# Patient Record
Sex: Female | Born: 1975 | Race: White | Hispanic: No | Smoking: Current every day smoker
Health system: Southern US, Community
[De-identification: ages and names within clinical notes are randomized; demographics above are authoritative.]

## PROBLEM LIST (undated history)

## (undated) DIAGNOSIS — M4056 Lordosis, unspecified, lumbar region: Secondary | ICD-10-CM

## (undated) DIAGNOSIS — Z9119 Patient's noncompliance with other medical treatment and regimen: Secondary | ICD-10-CM

## (undated) DIAGNOSIS — Z720 Tobacco use: Secondary | ICD-10-CM

## (undated) DIAGNOSIS — N879 Dysplasia of cervix uteri, unspecified: Secondary | ICD-10-CM

## (undated) DIAGNOSIS — M1990 Unspecified osteoarthritis, unspecified site: Secondary | ICD-10-CM

## (undated) DIAGNOSIS — G8929 Other chronic pain: Secondary | ICD-10-CM

## (undated) DIAGNOSIS — M549 Dorsalgia, unspecified: Secondary | ICD-10-CM

## (undated) DIAGNOSIS — Z91199 Patient's noncompliance with other medical treatment and regimen due to unspecified reason: Secondary | ICD-10-CM

## (undated) DIAGNOSIS — D649 Anemia, unspecified: Secondary | ICD-10-CM

## (undated) DIAGNOSIS — B1920 Unspecified viral hepatitis C without hepatic coma: Secondary | ICD-10-CM

## (undated) DIAGNOSIS — I10 Essential (primary) hypertension: Secondary | ICD-10-CM

## (undated) DIAGNOSIS — F419 Anxiety disorder, unspecified: Secondary | ICD-10-CM

## (undated) HISTORY — PX: WRIST SURGERY: SHX841

## (undated) HISTORY — PX: DENTAL SURGERY: SHX609

---
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
## 2015-12-12 ENCOUNTER — Emergency Department (HOSPITAL_COMMUNITY)
Admission: EM | Admit: 2015-12-12 | Discharge: 2015-12-12 | Disposition: A | Discharge status: Home / Self Care | Payer: No Typology Code available for payment source | Attending: Emergency Medicine | Admitting: Emergency Medicine

## 2015-12-12 ENCOUNTER — Emergency Department (HOSPITAL_COMMUNITY): Payer: No Typology Code available for payment source

## 2015-12-12 ENCOUNTER — Encounter (HOSPITAL_COMMUNITY): Payer: Self-pay | Admitting: *Deleted

## 2015-12-12 DIAGNOSIS — Y9289 Other specified places as the place of occurrence of the external cause: Secondary | ICD-10-CM | POA: Insufficient documentation

## 2015-12-12 DIAGNOSIS — L02411 Cutaneous abscess of right axilla: Secondary | ICD-10-CM

## 2015-12-12 DIAGNOSIS — S8265XA Nondisplaced fracture of lateral malleolus of left fibula, initial encounter for closed fracture: Secondary | ICD-10-CM

## 2015-12-12 DIAGNOSIS — F17200 Nicotine dependence, unspecified, uncomplicated: Secondary | ICD-10-CM | POA: Diagnosis not present

## 2015-12-12 DIAGNOSIS — J40 Bronchitis, not specified as acute or chronic: Secondary | ICD-10-CM | POA: Diagnosis not present

## 2015-12-12 DIAGNOSIS — Y999 Unspecified external cause status: Secondary | ICD-10-CM | POA: Diagnosis not present

## 2015-12-12 DIAGNOSIS — S99912A Unspecified injury of left ankle, initial encounter: Secondary | ICD-10-CM | POA: Diagnosis present

## 2015-12-12 DIAGNOSIS — Y939 Activity, unspecified: Secondary | ICD-10-CM | POA: Diagnosis not present

## 2015-12-12 MED ORDER — PROMETHAZINE-DM 6.25-15 MG/5ML PO SYRP: 5.0000 mL | ORAL_SOLUTION | Freq: Four times a day (QID) | ORAL | 0 refills | Status: DC | PRN

## 2015-12-12 MED ORDER — LIDOCAINE HCL 2 % IJ SOLN
20.0000 mL | Freq: Once | INTRAMUSCULAR | Status: AC
  Administered 2015-12-12: 400 mg
  Filled 2015-12-12: qty 20

## 2015-12-12 MED ORDER — GUAIFENESIN ER 1200 MG PO TB12: 1.0000 | ORAL_TABLET | Freq: Two times a day (BID) | ORAL | 0 refills | Status: DC

## 2015-12-12 MED ORDER — PREDNISONE 50 MG PO TABS: 50.0000 mg | ORAL_TABLET | Freq: Every day | ORAL | 0 refills | Status: DC

## 2015-12-12 MED ORDER — HYDROCODONE-ACETAMINOPHEN 5-325 MG PO TABS: 1.0000 | ORAL_TABLET | Freq: Four times a day (QID) | ORAL | 0 refills | Status: DC | PRN

## 2015-12-12 MED ORDER — HYDROCODONE-ACETAMINOPHEN 5-325 MG PO TABS
1.0000 | ORAL_TABLET | Freq: Once | ORAL | Status: AC
  Administered 2015-12-12: 1 via ORAL
  Filled 2015-12-12: qty 1

## 2015-12-12 NOTE — Progress Notes (Signed)
Orthopedic Tech Progress Note Patient Details:  Jeanette Capricelizabeth Diggs 12/23/1975 [id number]  Ortho Devices Type of Ortho Device: Crutches Ortho Device/Splint Interventions: Application   Nikki Domrawford, Plato Alspaugh 12/12/2015, 2:08 PM Buddy tape was done by nursing staff
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
## 2015-12-12 NOTE — ED Triage Notes (Signed)
PT requesting narcotics for pain.
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
## 2015-12-12 NOTE — Discharge Instructions (Signed)
Follow-up with the orthopedist provided.  Return here as needed.  Increase her fluid intake.  Ice and elevate your ankle

## 2016-01-14 ENCOUNTER — Encounter (HOSPITAL_COMMUNITY): Payer: Self-pay | Admitting: Emergency Medicine

## 2016-01-14 ENCOUNTER — Emergency Department (HOSPITAL_COMMUNITY): Payer: Self-pay

## 2016-01-14 ENCOUNTER — Emergency Department (HOSPITAL_COMMUNITY)
Admission: EM | Admit: 2016-01-14 | Discharge: 2016-01-14 | Disposition: A | Discharge status: Home / Self Care | Payer: Self-pay | Attending: Emergency Medicine | Admitting: Emergency Medicine

## 2016-01-14 DIAGNOSIS — F17200 Nicotine dependence, unspecified, uncomplicated: Secondary | ICD-10-CM | POA: Insufficient documentation

## 2016-01-14 DIAGNOSIS — L02411 Cutaneous abscess of right axilla: Secondary | ICD-10-CM | POA: Insufficient documentation

## 2016-01-14 DIAGNOSIS — L0291 Cutaneous abscess, unspecified: Secondary | ICD-10-CM

## 2016-01-14 DIAGNOSIS — B349 Viral infection, unspecified: Secondary | ICD-10-CM | POA: Insufficient documentation

## 2016-01-14 MED ORDER — BENZONATATE 100 MG PO CAPS
200.0000 mg | ORAL_CAPSULE | Freq: Once | ORAL | Status: AC
  Administered 2016-01-14: 200 mg via ORAL
  Filled 2016-01-14: qty 2

## 2016-01-14 MED ORDER — LIDOCAINE-EPINEPHRINE (PF) 2 %-1:200000 IJ SOLN
10.0000 mL | Freq: Once | INTRAMUSCULAR | Status: AC
  Administered 2016-01-14: 10 mL
  Filled 2016-01-14: qty 20

## 2016-01-14 MED ORDER — OXYMETAZOLINE HCL 0.05 % NA SOLN: 1.0000 | Freq: Two times a day (BID) | NASAL | 0 refills | Status: AC

## 2016-01-14 MED ORDER — BENZONATATE 100 MG PO CAPS: 200.0000 mg | ORAL_CAPSULE | Freq: Two times a day (BID) | ORAL | 0 refills | Status: AC | PRN

## 2016-01-14 MED ORDER — CEPHALEXIN 500 MG PO CAPS: 500.0000 mg | ORAL_CAPSULE | Freq: Four times a day (QID) | ORAL | 0 refills | Status: DC

## 2016-01-14 MED ORDER — ACETAMINOPHEN 500 MG PO TABS
1000.0000 mg | ORAL_TABLET | Freq: Once | ORAL | Status: AC
  Administered 2016-01-14: 1000 mg via ORAL
  Filled 2016-01-14: qty 2

## 2016-01-14 MED ORDER — SULFAMETHOXAZOLE-TRIMETHOPRIM 800-160 MG PO TABS: 1.0000 | ORAL_TABLET | Freq: Two times a day (BID) | ORAL | 0 refills | Status: AC

## 2016-01-14 NOTE — ED Provider Notes (Signed)
[facility] Provider Note    By signing my name below, I, Earmon Phoenix, attest that this documentation has been prepared under the direction and in the presence of Melburn Hake, PA-C. Electronically Signed: Earmon Phoenix, ED Scribe. 01/14/16. 3:46 PM.    History   Chief Complaint Chief Complaint  Patient presents with  . Cough  . Wound Infection   The history is provided by the patient and medical records. No language interpreter was used.    Kelli Franklin is an obese 41 y.o. female who presents to the Emergency Department complaining of dry cough that began about one week ago. She reports associated generalized body aches, HA, diaphoresis and wheezing. She has taken Alka Seltzer cough and cold, Tylenol sinus medication and Ibuprofen for her symptoms with no significant relief. Coughing and moving increase her pain. Pt denies alleviating  factors. Denies any known sick contacts or recent hospitalizations. She denies sore throat, fever, chills, neck stiffness, nasal congestion, rhinorrhea, abdominal pain, nausea, vomiting, diarrhea, dysuria, rash, CP, sinus pain or pressure. She is a smoker of 1 PPD but states she has not smoked but about 5 per day since getting sick.   She also reports several abscesses to her right axilla that appeared about one week ago. She reports she has these recurrently and has had them incised and drained in the past. She has not done anything to treat the area. Touching the area or elevating the RUE increases the pain. She denies alleviating factors.    History reviewed. No pertinent past medical history.  There are no active problems to display for this patient.   Past Surgical History:  Procedure Laterality Date  . WRIST SURGERY      OB History    No data available       Home Medications    Prior to Admission medications   Medication Sig Start Date End Date Taking? Authorizing Provider  ibuprofen (ADVIL,MOTRIN) 200 MG tablet  Take 800 mg by mouth every 8 (eight) hours as needed for moderate pain.   Yes Historical Provider, MD  Phenyleph-Doxylamine-DM-APAP (ALKA-SELTZER PLS ALLERGY & CGH) 5-6.25-10-325 MG CAPS Take 2 capsules by mouth daily as needed (cold).   Yes Historical Provider, MD  benzonatate (TESSALON) 100 MG capsule Take 2 capsules (200 mg total) by mouth 2 (two) times daily as needed for cough. 01/14/16   Barrett Henle, PA-C  cephALEXin (KEFLEX) 500 MG capsule Take 1 capsule (500 mg total) by mouth 4 (four) times daily. 01/14/16   Barrett Henle, PA-C  oxymetazoline (AFRIN NASAL SPRAY) 0.05 % nasal spray Place 1 spray into both nostrils 2 (two) times daily. Spray once into each nostril twice daily for up to the next 3 days. Do not use for more than 3 days to prevent rebound rhinorrhea. 01/14/16   Barrett Henle, PA-C  sulfamethoxazole-trimethoprim (BACTRIM DS,SEPTRA DS) 800-160 MG tablet Take 1 tablet by mouth 2 (two) times daily. 01/14/16 01/21/16  Barrett Henle, PA-C    Family History No family history on file.  Social History Social History  Substance Use Topics  . Smoking status: Current Every Day Smoker  . Smokeless tobacco: Never Used  . Alcohol use No     Allergies   Sulfa antibiotics   Review of Systems Review of Systems  Constitutional: Negative for fever.  HENT: Negative for congestion, rhinorrhea, sinus pain, sinus pressure and sore throat.   Respiratory: Positive for cough and wheezing.   Cardiovascular: Negative for chest pain.  Gastrointestinal: Negative for abdominal pain, diarrhea, nausea and vomiting.  Musculoskeletal: Positive for myalgias.  Skin: Positive for color change (right axilla abscess). Negative for rash.  Neurological: Positive for headaches.     Physical Exam Updated Vital Signs BP 130/83 (BP Location: Left Arm)   Pulse 72   Temp 97.8 F (36.6 C) (Oral)   Resp 16   SpO2 99%   Physical Exam  Constitutional: She is  oriented to person, place, and time. She appears well-developed and well-nourished.  HENT:  Head: Normocephalic and atraumatic.  Right Ear: Tympanic membrane normal.  Left Ear: Tympanic membrane normal.  Nose: Nose normal. Right sinus exhibits no maxillary sinus tenderness and no frontal sinus tenderness. Left sinus exhibits no maxillary sinus tenderness and no frontal sinus tenderness.  Mouth/Throat: Uvula is midline, oropharynx is clear and moist and mucous membranes are normal. No oropharyngeal exudate, posterior oropharyngeal edema, posterior oropharyngeal erythema or tonsillar abscesses. No tonsillar exudate.  Eyes: Conjunctivae and EOM are normal. Right eye exhibits no discharge. Left eye exhibits no discharge. No scleral icterus.  Cardiovascular: Normal rate, regular rhythm, normal heart sounds and intact distal pulses.   Pulmonary/Chest: Effort normal. She has wheezes. She has rales.  Mild expiratory wheezing and rales in left lower lobe. Pt without signs of increased work of breathing or respiratory distress.  Abdominal: Soft. Bowel sounds are normal. There is no tenderness.  Musculoskeletal: She exhibits no edema.  Neurological: She is alert and oriented to person, place, and time.  Skin: Skin is warm and dry.  1x2cm area of induration and fluctuance noted to right axilla. No surrounding erythema, warmth or drainage noted.  Nursing note and vitals reviewed.    ED Treatments / Results  DIAGNOSTIC STUDIES: Oxygen Saturation is 99% on RA, normal by my interpretation.   COORDINATION OF CARE: 2:38 PM- Will order CXR. Will I & D abscess of right axilla. Pt verbalizes understanding and agrees to plan.   Medications  acetaminophen (TYLENOL) tablet 1,000 mg (1,000 mg Oral Given 01/14/16 1529)  benzonatate (TESSALON) capsule 200 mg (200 mg Oral Given 01/14/16 1529)  lidocaine-EPINEPHrine (XYLOCAINE W/EPI) 2 %-1:200000 (PF) injection 10 mL (10 mLs Infiltration Given 01/14/16 1530)     Labs (all labs ordered are listed, but only abnormal results are displayed) Labs Reviewed - No data to display  EKG  EKG Interpretation None       Radiology Dg Chest 2 View  Result Date: 01/14/2016 CLINICAL DATA:  Cough over the last week.  Body aches and chills. EXAM: CHEST  2 VIEW COMPARISON:  12/12/2015 FINDINGS: Central bronchial thickening. No infiltrate, mass, effusion or collapse. No abnormal bone finding. Heart size is normal. Pulmonary vascularity is normal. IMPRESSION: Bronchial thickening.  No infiltrate or collapse. Electronically Signed   By: Paulina FusiMark  Shogry M.D.   On: 01/14/2016 15:02    Procedures .Marland Kitchen.Incision and Drainage Date/Time: 01/14/2016 4:36 PM Performed by: Barrett HenleNADEAU, NICOLE Shakila Authorized by: Barrett HenleNADEAU, NICOLE Aquita   Consent:    Consent obtained:  Verbal   Consent given by:  Patient Location:    Type:  Abscess   Size:  1x2cm   Location:  Upper extremity   Upper extremity location: right axilla. Pre-procedure details:    Skin preparation:  Betadine Anesthesia (see MAR for exact dosages):    Anesthesia method:  Local infiltration   Local anesthetic:  Lidocaine 2% WITH epi Procedure type:    Complexity:  Simple Procedure details:    Incision types:  Single straight  Incision depth:  Dermal   Scalpel blade:  11   Wound management:  Probed and deloculated and irrigated with saline   Drainage:  Bloody and purulent   Drainage amount:  Scant   Wound treatment:  Wound left open   Packing materials:  None Post-procedure details:    Patient tolerance of procedure:  Tolerated well, no immediate complications   (including critical care time)  Medications Ordered in ED Medications  acetaminophen (TYLENOL) tablet 1,000 mg (1,000 mg Oral Given 01/14/16 1529)  benzonatate (TESSALON) capsule 200 mg (200 mg Oral Given 01/14/16 1529)  lidocaine-EPINEPHrine (XYLOCAINE W/EPI) 2 %-1:200000 (PF) injection 10 mL (10 mLs Infiltration Given 01/14/16 1530)      Initial Impression / Assessment and Plan / ED Course  I have reviewed the triage vital signs and the nursing notes.  Pertinent labs & imaging results that were available during my care of the patient were reviewed by me and considered in my medical decision making (see chart for details).  Clinical Course     Patient with symptoms consistent with influenza.  Vitals are stable, afebrile.  No signs of dehydration, tolerating PO's.  CXR negative.  Discussed the cost versus benefit of Tamiflu treatment with the patient.  The patient understands that symptoms are greater than the recommended 24-48 hour window of treatment.  Patient will be discharged with instructions to orally hydrate, rest, and use over-the-counter medications such as anti-inflammatories ibuprofen and Aleve for muscle aches and Tylenol for fever.  Patient will also be given a cough suppressant and decongestant.   Patient with skin abscess amenable to incision and drainage.  Abscess was not large enough to warrant packing or drain,  wound recheck in 2 days. Encouraged home warm soaks and flushing.  Patient discharged home with prescription of Keflex and Bactrim due to history of recurrent abscesses.  Will d/c to home.  Discussed return precautions.  I personally performed the services described in this documentation, which was scribed in my presence. The recorded information has been reviewed and is accurate.   Final Clinical Impressions(s) / ED Diagnoses   Final diagnoses:  Viral illness  Abscess    New Prescriptions New Prescriptions   BENZONATATE (TESSALON) 100 MG CAPSULE    Take 2 capsules (200 mg total) by mouth 2 (two) times daily as needed for cough.   CEPHALEXIN (KEFLEX) 500 MG CAPSULE    Take 1 capsule (500 mg total) by mouth 4 (four) times daily.   OXYMETAZOLINE (AFRIN NASAL SPRAY) 0.05 % NASAL SPRAY    Place 1 spray into both nostrils 2 (two) times daily. Spray once into each nostril twice daily for up to the  next 3 days. Do not use for more than 3 days to prevent rebound rhinorrhea.   SULFAMETHOXAZOLE-TRIMETHOPRIM (BACTRIM DS,SEPTRA DS) 800-160 MG TABLET    Take 1 tablet by mouth 2 (two) times daily.     Brandalyn Harting [place], [place] 01/14/16 1638    Shaune Pollack, MD 01/15/16 [phone]

## 2016-01-14 NOTE — ED Notes (Signed)
Pt verbalized understanding discharge instructions and denies any further needs or questions at this time. VS stable, ambulatory and steady gait.   

## 2016-01-14 NOTE — Discharge Instructions (Signed)
Take your antibiotics as prescribed until completed. I recommend continuing to apply warm compresses 3-4 times daily for 15-20 minutes to help with your abscess. You may also take Tylenol or ibuprofen as prescribed over-the-counter as needed for pain relief. Continue drinking fluids at home to remain hydrated. I also recommend eating a bland diet for the next few days and her symptoms have improved. Follow-up with your primary care provider in the next 3-4 days if your symptoms have not improved or have worsened. Please return to the Emergency Department if symptoms worsen or new onset of fever, redness, swelling, warmth, difficulty breathing, vomiting, unable to keep fluids down.

## 2016-01-14 NOTE — ED Triage Notes (Addendum)
Cough x 1 week  otc meds not helping  Body aches and chills also has lump under rt arm she wants looked at

## 2016-02-07 ENCOUNTER — Emergency Department
Admission: EM | Admit: 2016-02-07 | Discharge: 2016-02-08 | Disposition: A | Discharge status: Home / Self Care | Payer: Medicaid Other | Attending: Emergency Medicine | Admitting: Emergency Medicine

## 2016-02-07 ENCOUNTER — Encounter: Payer: Self-pay | Admitting: Emergency Medicine

## 2016-02-07 DIAGNOSIS — F1310 Sedative, hypnotic or anxiolytic abuse, uncomplicated: Secondary | ICD-10-CM

## 2016-02-07 DIAGNOSIS — T50901A Poisoning by unspecified drugs, medicaments and biological substances, accidental (unintentional), initial encounter: Secondary | ICD-10-CM

## 2016-02-07 DIAGNOSIS — I10 Essential (primary) hypertension: Secondary | ICD-10-CM | POA: Insufficient documentation

## 2016-02-07 DIAGNOSIS — T424X1A Poisoning by benzodiazepines, accidental (unintentional), initial encounter: Secondary | ICD-10-CM

## 2016-02-07 DIAGNOSIS — T402X4A Poisoning by other opioids, undetermined, initial encounter: Secondary | ICD-10-CM | POA: Insufficient documentation

## 2016-02-07 DIAGNOSIS — F17200 Nicotine dependence, unspecified, uncomplicated: Secondary | ICD-10-CM | POA: Insufficient documentation

## 2016-02-07 DIAGNOSIS — T424X4A Poisoning by benzodiazepines, undetermined, initial encounter: Secondary | ICD-10-CM | POA: Insufficient documentation

## 2016-02-07 DIAGNOSIS — Z79899 Other long term (current) drug therapy: Secondary | ICD-10-CM | POA: Insufficient documentation

## 2016-02-07 DIAGNOSIS — F1120 Opioid dependence, uncomplicated: Secondary | ICD-10-CM

## 2016-02-07 DIAGNOSIS — T50904A Poisoning by unspecified drugs, medicaments and biological substances, undetermined, initial encounter: Secondary | ICD-10-CM

## 2016-02-07 DIAGNOSIS — Z791 Long term (current) use of non-steroidal anti-inflammatories (NSAID): Secondary | ICD-10-CM | POA: Insufficient documentation

## 2016-02-07 HISTORY — DX: Lordosis, unspecified, lumbar region: M40.56

## 2016-02-07 HISTORY — DX: Unspecified osteoarthritis, unspecified site: M19.90

## 2016-02-07 HISTORY — DX: Patient's noncompliance with other medical treatment and regimen: Z91.19

## 2016-02-07 HISTORY — DX: Dorsalgia, unspecified: M54.9

## 2016-02-07 HISTORY — DX: Patient's noncompliance with other medical treatment and regimen due to unspecified reason: Z91.199

## 2016-02-07 HISTORY — DX: Dysplasia of cervix uteri, unspecified: N87.9

## 2016-02-07 HISTORY — DX: Anemia, unspecified: D64.9

## 2016-02-07 HISTORY — DX: Unspecified viral hepatitis C without hepatic coma: B19.20

## 2016-02-07 HISTORY — DX: Other chronic pain: G89.29

## 2016-02-07 HISTORY — DX: Essential (primary) hypertension: I10

## 2016-02-07 HISTORY — DX: Anxiety disorder, unspecified: F41.9

## 2016-02-07 HISTORY — DX: Tobacco use: Z72.0

## 2016-02-07 LAB — COMPREHENSIVE METABOLIC PANEL
ALK PHOS: 66 U/L (ref 38–126)
ALT: 159 U/L — ABNORMAL HIGH (ref 14–54)
AST: 60 U/L — ABNORMAL HIGH (ref 15–41)
Albumin: 4.1 g/dL (ref 3.5–5.0)
Anion gap: 9 (ref 5–15)
BILIRUBIN TOTAL: 0.3 mg/dL (ref 0.3–1.2)
BUN: 8 mg/dL (ref 6–20)
CALCIUM: 8.9 mg/dL (ref 8.9–10.3)
CO2: 25 mmol/L (ref 22–32)
CREATININE: 0.65 mg/dL (ref 0.44–1.00)
Chloride: 104 mmol/L (ref 101–111)
Glucose, Bld: 116 mg/dL — ABNORMAL HIGH (ref 65–99)
Potassium: 3.4 mmol/L — ABNORMAL LOW (ref 3.5–5.1)
Sodium: 138 mmol/L (ref 135–145)
Total Protein: 7.8 g/dL (ref 6.5–8.1)

## 2016-02-07 LAB — CBC
HCT: 37.9 % (ref 35.0–47.0)
Hemoglobin: 13.3 g/dL (ref 12.0–16.0)
MCH: 29.8 pg (ref 26.0–34.0)
MCHC: 35.2 g/dL (ref 32.0–36.0)
MCV: 84.8 fL (ref 80.0–100.0)
PLATELETS: 229 10*3/uL (ref 150–440)
RBC: 4.47 MIL/uL (ref 3.80–5.20)
RDW: 13.8 % (ref 11.5–14.5)
WBC: 7.8 10*3/uL (ref 3.6–11.0)

## 2016-02-07 LAB — SALICYLATE LEVEL

## 2016-02-07 LAB — ACETAMINOPHEN LEVEL: Acetaminophen (Tylenol), Serum: <10 ug/mL — ABNORMAL LOW (ref 10–30)

## 2016-02-07 LAB — ETHANOL

## 2016-02-07 NOTE — ED Triage Notes (Signed)
Pt presents to ED via ems from home c/o drug overdose: xanax, oxycodone and unknown substance. Pt vomited x1, awake on arrival.

## 2016-02-07 NOTE — ED Notes (Addendum)
Spoke with poison control . Stated pt to be watched for 6 hours. AVoid narcan if possible; if choose to give then give in small doses to avoid intubation

## 2016-02-07 NOTE — ED Provider Notes (Signed)
[facility] Emergency Department Provider Note  ____________________________________________   First MD Initiated Contact with Patient 02/07/16 2255     (approximate)  I have reviewed the triage vital signs and the nursing notes.   HISTORY  Chief Complaint Drug Overdose  Level 5 caveat:  history/ROS limited by acute/critical illness  HPI Kelli Franklin is a 41 y.o. female with a medical history as listed below which was gleaned from the EMR from prior visits to [facility].  She presents for evaluation after an alleged overdose.No additional detail is available except that she reportedly overdosed on Xanax, oxycodone, and an unknown substance.  No other details are available.  She was reportedly awake and vomited once upon arrival.  She is currently obtunded but protecting her airway.   It is unclear whether this was a suicide attempt or not.  No other details are currently available.  He is in control was contacted and advised the nursing staff to watch her for 6 hours and avoid Narcan if possible but to give it in small doses if necessary to avoid intubation.   Past Medical History:  Diagnosis Date  . Anemia   . Anxiety   . Arthritis   . Cervical dysplasia 1996  . Chronic back pain   . Hepatitis C   . History of noncompliance with medical treatment   . Hypertension   . Lordosis of lumbar region   . Tobacco abuse     There are no active problems to display for this patient.   Past Surgical History:  Procedure Laterality Date  . DENTAL SURGERY    . WRIST SURGERY      Prior to Admission medications   Medication Sig Start Date End Date Taking? Authorizing Provider  benzonatate (TESSALON) 100 MG capsule Take 2 capsules (200 mg total) by mouth 2 (two) times daily as needed for cough. 01/14/16   Barrett Henle, PA-C  cephALEXin (KEFLEX) 500 MG capsule Take 1 capsule (500 mg total) by mouth 4 (four) times daily. 01/14/16   Barrett Henle, PA-C  ibuprofen (ADVIL,MOTRIN) 200 MG tablet Take 800 mg by mouth every 8 (eight) hours as needed for moderate pain.    Historical Provider, MD  oxymetazoline (AFRIN NASAL SPRAY) 0.05 % nasal spray Place 1 spray into both nostrils 2 (two) times daily. Spray once into each nostril twice daily for up to the next 3 days. Do not use for more than 3 days to prevent rebound rhinorrhea. 01/14/16   Barrett Henle, PA-C  Phenyleph-Doxylamine-DM-APAP (ALKA-SELTZER PLS ALLERGY & [facility]) 5-6.25-10-325 MG CAPS Take 2 capsules by mouth daily as needed (cold).    Historical Provider, MD    Allergies Sulfa antibiotics  History reviewed. No pertinent family history.  Social History Social History  Substance Use Topics  . Smoking status: Current Every Day Smoker  . Smokeless tobacco: Never Used  . Alcohol use Yes     Comment: reportedly rare ETOH use    Review of Systems  Level 5 caveat:  history/ROS limited by acute/critical illness ____________________________________________   PHYSICAL EXAM:  VITAL SIGNS: ED Triage Vitals  Enc Vitals Group     BP 02/07/16 2239 122/87     Pulse Rate 02/07/16 2239 (!) 104     Resp 02/07/16 2239 (!) 22     Temp --      Temp src --      SpO2 02/07/16 2239 100 %     Weight 02/07/16 2310 200 lb (  90.7 kg)     Height 02/07/16 2310 5\' 6"  (1.676 m)     Head Circumference --      Peak Flow --      Pain Score --      Pain Loc --      Pain Edu? --      Excl. in GC? --     Constitutional: The patient is obtunded but withdraws from noxious stimuli Eyes: Conjunctivae are normal.  Pupils are dilated and minimal responsive to light but equal bilaterally Head: Atraumatic. Nose: No congestion/rhinnorhea. Mouth/Throat: Mucous membranes are moist.  Oropharynx non-erythematous. Neck: No stridor.  No meningeal signs.  No cervical spine tenderness to palpation. Cardiovascular: Normal rate, regular rhythm. Good peripheral circulation. Grossly normal heart  sounds. Respiratory: Normal respiratory effort.  No retractions. Lungs CTAB.  Respirations are appropriate, non-agonal, currently protecting airway Gastrointestinal: Soft and nontender. No distention.  Musculoskeletal: No lower extremity tenderness nor edema. No gross deformities of extremities. Neurologic:  Too intoxicated to participate in neurological exam but she moves all 4 extremities upon application of painful stimuli and she has no obvious facial droop Skin:  Skin is warm, dry and intact. No rash noted.   ____________________________________________   LABS (all labs ordered are listed, but only abnormal results are displayed)  Labs Reviewed  COMPREHENSIVE METABOLIC PANEL - Abnormal; Notable for the following:       Result Value   Potassium 3.4 (*)    Glucose, Bld 116 (*)    AST 60 (*)    ALT 159 (*)    All other components within normal limits  ACETAMINOPHEN LEVEL - Abnormal; Notable for the following:    Acetaminophen (Tylenol), Serum <10 (*)    All other components within normal limits  URINE DRUG SCREEN, QUALITATIVE ([facility] ONLY) - Abnormal; Notable for the following:    Benzodiazepine, Ur Scrn POSITIVE (*)    Methadone Scn, Ur POSITIVE (*)    All other components within normal limits  URINALYSIS, ROUTINE W REFLEX MICROSCOPIC - Abnormal; Notable for the following:    Color, Urine YELLOW (*)    APPearance CLEAR (*)    Ketones, ur 5 (*)    All other components within normal limits  ETHANOL  SALICYLATE LEVEL  CBC  CBG MONITORING, ED   ____________________________________________  EKG  ED ECG REPORT I, Haley Fuerstenberg, the attending physician, personally viewed and interpreted this ECG.  Date: 02/07/2016 EKG Time: 22:40 Rate: 106 Rhythm: Borderline sinus tachycardia QRS Axis: normal Intervals: normal (QTc 456 ms) ST/T Wave abnormalities: normal Conduction Disturbances: none Narrative Interpretation:  unremarkable  ____________________________________________  RADIOLOGY   No results found.  ____________________________________________   PROCEDURES  Procedure(s) performed:   Procedures   Critical Care performed: No ____________________________________________   INITIAL IMPRESSION / ASSESSMENT AND PLAN / ED COURSE  Pertinent labs & imaging results that were available during my care of the patient were reviewed by me and considered in my medical decision making (see chart for details).   Clinical Course as of Feb 07 725  Mon Feb 07, 2016  2301 Patient obtunded but protecting airway, awakens to noxious stimuli.  Satting 99% on RA.  On monitor and continuous pulse oximetry.  Will continue to observe carefully for decline in status, need for airway intervention, additional Narcan, etc.  Unclear intentions as minimal history available, will place under IVC given known overdose so she will be seen by psychiatry tomorrow.  [CF]  Tue Feb 08, 2016  0559 Patient still sleeping,  but able to communicate with nurse when he wakes her up.  Still protecting airway.  [CF]  [phone] Patient has been stable overnight.  Transferring ED care to Dr. Mayford Knife.  [CF]    Clinical Course User Index [CF] Loleta Rose, MD    ____________________________________________  FINAL CLINICAL IMPRESSION(S) / ED DIAGNOSES  Final diagnoses:  Drug overdose, undetermined intent, initial encounter     MEDICATIONS GIVEN DURING THIS VISIT:  Medications - No data to display   NEW OUTPATIENT MEDICATIONS STARTED DURING THIS VISIT:  New Prescriptions   No medications on file    Modified Medications   No medications on file    Discontinued Medications   No medications on file     Note:  This document was prepared using Dragon voice recognition software and may include unintentional dictation errors.    Loleta Rose, MD 02/08/16 [phone]

## 2016-02-08 DIAGNOSIS — T424X1A Poisoning by benzodiazepines, accidental (unintentional), initial encounter: Secondary | ICD-10-CM

## 2016-02-08 DIAGNOSIS — F1310 Sedative, hypnotic or anxiolytic abuse, uncomplicated: Secondary | ICD-10-CM

## 2016-02-08 DIAGNOSIS — F1120 Opioid dependence, uncomplicated: Secondary | ICD-10-CM

## 2016-02-08 LAB — URINALYSIS, ROUTINE W REFLEX MICROSCOPIC
BILIRUBIN URINE: NEGATIVE
GLUCOSE, UA: NEGATIVE mg/dL
Hgb urine dipstick: NEGATIVE
KETONES UR: 5 mg/dL — AB
Leukocytes, UA: NEGATIVE
Nitrite: NEGATIVE
PH: 6 (ref 5.0–8.0)
Protein, ur: NEGATIVE mg/dL
SPECIFIC GRAVITY, URINE: 1.023 (ref 1.005–1.030)

## 2016-02-08 LAB — URINE DRUG SCREEN, QUALITATIVE (ARMC ONLY)
Amphetamines, Ur Screen: NOT DETECTED
Barbiturates, Ur Screen: NOT DETECTED
Benzodiazepine, Ur Scrn: POSITIVE — AB
CANNABINOID 50 NG, UR ~~LOC~~: NOT DETECTED
COCAINE METABOLITE, UR ~~LOC~~: NOT DETECTED
MDMA (ECSTASY) UR SCREEN: NOT DETECTED
Methadone Scn, Ur: POSITIVE — AB
OPIATE, UR SCREEN: NOT DETECTED
Phencyclidine (PCP) Ur S: NOT DETECTED
Tricyclic, Ur Screen: NOT DETECTED

## 2016-02-08 NOTE — ED Notes (Signed)
BEHAVIORAL HEALTH ROUNDING Patient sleeping: Yes.   Patient alert and oriented: eyes closed  Appears to be asleep Behavior appropriate: Yes.  ; If no, describe:  Nutrition and fluids offered: Yes  Toileting and hygiene offered: sleeping Sitter present: q 15 minute observations and security monitoring Law enforcement present: yes  ODS 

## 2016-02-08 NOTE — ED Notes (Addendum)
Sister Milon ScoreRegina Becraft states came to visit the pt in the ED, states she is the closest relative in town and states to contact her at [phone] for question or changes in pt condition.

## 2016-02-08 NOTE — ED Notes (Signed)
BEHAVIORAL HEALTH ROUNDING Patient sleeping: No. Patient alert and oriented: yes Behavior appropriate: Yes.  ; If no, describe:  Nutrition and fluids offered: yes Toileting and hygiene offered: Yes  Sitter present: q15 minute observations and security  monitoring Law enforcement present: Yes  ODS  

## 2016-02-08 NOTE — ED Notes (Signed)
Visitor at bedside  - observed by Carlisle BeersLuAnn

## 2016-02-08 NOTE — ED Notes (Signed)

## 2016-02-08 NOTE — ED Notes (Signed)
Read pt chart for report information  - pt moved to room 19H

## 2016-02-08 NOTE — ED Notes (Signed)
Pt given lunch tray.

## 2016-02-08 NOTE — ED Provider Notes (Signed)
-----------------------------------------   3:28 PM on 02/08/2016 -----------------------------------------   Blood pressure 124/78, pulse 96, resp. rate 20, height 5\' 6"  (1.676 m), weight 200 lb (90.7 kg), SpO2 99 %.  The patient had no acute events since last update.  Calm and cooperative at this time.  No suicidal or homicidal ideation. Seen and evaluated by Dr. Toni Amendlapacs who does not have any further reason to keep the patient under commitment. Possible drug abuse which at this point seems more likely than a suicide attempt. Patient will be following up at [place]adone clinic in [place]. Patient to be discharged home.    Myrna Blazeravid Matthew Schaevitz, MD 02/08/16 [phone]

## 2016-02-08 NOTE — Consult Note (Signed)
[facility] Consult   Reason for Consult:  Consult for 41 year old woman with history of opiate dependence brought into the hospital with a presumed overdose Referring Physician:  Corky Downs Patient Identification: Kelli Franklin MRN:  [id number] Principal Diagnosis: Benzodiazepine overdose Diagnosis:   Patient Active Problem List   Diagnosis Date Noted  . Benzodiazepine overdose [T42.4X1A] 02/08/2016  . Opiate dependence ([place]) [F11.20] 02/08/2016  . 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 female patient admitted with "somebody gave me a couple pills".  HPI:  Patient interviewed. Chart reviewed. I also spoke to the patient's sister who is the person who called 911. Patient tells me that yesterday she had a headache and that a friend of hers had given her "a couple of Advil". She says she took them and the next thing she remembered was waking up here. When I asked her to repeat the story changed. She admitted that they were not Advil and furthermore admitted that she was not trying to treat a headache but was just trying to go to sleep and that she had some pills that she knew to be sedatives. Nevertheless she denies any suicidal intent. Says that her mood is been stressed out for years. Not any more depressed than usual. Chronic sleep problems no different than usual. No appetite problems. Denies suicidal or homicidal thoughts. Denies hallucinations or psychosis. Patient's sister reports that she found a bottle near the patient that had multiple pills in it with a combination of clonazepam's and oxycodone's and Soma pills of present. She admits however that the patient had not said or done anything to suggest suicidality and she believes the patient is just abusing drugs.  Social history: Divorced. Separated from a boyfriend 2. Lives with her sister. Sister tells me that she is trying to get the patient  to move out which is probably another stress.  Medical history: Recovering from overdose of her sedatives without difficulty. She denies knowing of any significant ongoing medical problems. She does have elevated liver enzymes.  Substance abuse history: Long history of opiate dependence currently in a methadone program out of [place].  Past Psychiatric History: Patient denies past psychiatric hospitalization. Denies suicide attempts. Denies violence. Says the only medicine she's ever been prescribed for mood or anxiety with Xanax but she hasn't taken it in years supposedly.  Risk to Self: Is patient at risk for suicide?: No Risk to Others:   Prior Inpatient Therapy:   Prior Outpatient Therapy:    Past Medical History:  Past Medical History:  Diagnosis Date  . Anemia   . Anxiety   . Arthritis   . Cervical dysplasia 1996  . Chronic back pain   . Hepatitis C   . History of noncompliance with medical treatment   . Hypertension   . Lordosis of lumbar region   . Tobacco abuse     Past Surgical History:  Procedure Laterality Date  . DENTAL SURGERY    . WRIST SURGERY     Family History: History reviewed. No pertinent family history. Family Psychiatric  History: She says there are several minute her family with alcohol abuse problems Social History:  History  Alcohol Use  . Yes    Comment: reportedly rare ETOH use     History  Drug Use No    Social History   Social History  . Marital status: Legally Separated    Spouse name:  N/A  . Number of children: N/A  . Years of education: N/A   Social History Main Topics  . Smoking status: Current Every Day Smoker  . Smokeless tobacco: Never Used  . Alcohol use Yes     Comment: reportedly rare ETOH use  . Drug use: No  . Sexual activity: Not Asked   Other Topics Concern  . None   Social History Narrative  . None   Additional Social History:    Allergies:   Allergies  Allergen Reactions  . Sulfa Antibiotics Nausea  And Vomiting    Labs:  Results for orders placed or performed during the hospital encounter of 02/07/16 (from the past 48 hour(s))  Comprehensive metabolic panel     Status: Abnormal   Collection Time: 02/07/16 10:44 PM  Result Value Ref Range   Sodium 138 135 - 145 mmol/L   Potassium 3.4 (L) 3.5 - 5.1 mmol/L   Chloride 104 101 - 111 mmol/L   CO2 25 22 - 32 mmol/L   Glucose, Bld 116 (H) 65 - 99 mg/dL   BUN 8 6 - 20 mg/dL   Creatinine, Ser 0.65 0.44 - 1.00 mg/dL   Calcium 8.9 8.9 - 10.3 mg/dL   Total Protein 7.8 6.5 - 8.1 g/dL   Albumin 4.1 3.5 - 5.0 g/dL   AST 60 (H) 15 - 41 U/L   ALT 159 (H) 14 - 54 U/L   Alkaline Phosphatase 66 38 - 126 U/L   Total Bilirubin 0.3 0.3 - 1.2 mg/dL   GFR calc non Af Amer >60 >60 mL/min   GFR calc Af Amer >60 >60 mL/min    Comment: (NOTE) The eGFR has been calculated using the CKD EPI equation. This calculation has not been validated in all clinical situations. eGFR's persistently <60 mL/min signify possible Chronic Kidney Disease.    Anion gap 9 5 - 15  Ethanol     Status: None   Collection Time: 02/07/16 10:44 PM  Result Value Ref Range   Alcohol, Ethyl (B) <5 <5 mg/dL    Comment:        LOWEST DETECTABLE LIMIT FOR SERUM ALCOHOL IS 5 mg/dL FOR MEDICAL PURPOSES ONLY   Salicylate level     Status: None   Collection Time: 02/07/16 10:44 PM  Result Value Ref Range   Salicylate Lvl <5.6 2.8 - 30.0 mg/dL  Acetaminophen level     Status: Abnormal   Collection Time: 02/07/16 10:44 PM  Result Value Ref Range   Acetaminophen (Tylenol), Serum <10 (L) 10 - 30 ug/mL    Comment:        THERAPEUTIC CONCENTRATIONS VARY SIGNIFICANTLY. A RANGE OF 10-30 ug/mL MAY BE AN EFFECTIVE CONCENTRATION FOR MANY PATIENTS. HOWEVER, SOME ARE BEST TREATED AT CONCENTRATIONS OUTSIDE THIS RANGE. ACETAMINOPHEN CONCENTRATIONS >150 ug/mL AT 4 HOURS AFTER INGESTION AND >50 ug/mL AT 12 HOURS AFTER INGESTION ARE OFTEN ASSOCIATED WITH TOXIC REACTIONS.   cbc      Status: None   Collection Time: 02/07/16 10:44 PM  Result Value Ref Range   WBC 7.8 3.6 - 11.0 K/uL   RBC 4.47 3.80 - 5.20 MIL/uL   Hemoglobin 13.3 12.0 - 16.0 g/dL   HCT 37.9 35.0 - 47.0 %   MCV 84.8 80.0 - 100.0 fL   MCH 29.8 26.0 - 34.0 pg   MCHC 35.2 32.0 - 36.0 g/dL   RDW 13.8 11.5 - 14.5 %   Platelets 229 150 - 440 K/uL  Urine Drug Screen, Qualitative  Status: Abnormal   Collection Time: 02/07/16 10:45 PM  Result Value Ref Range   Tricyclic, Ur Screen NONE DETECTED NONE DETECTED   Amphetamines, Ur Screen NONE DETECTED NONE DETECTED   MDMA (Ecstasy)Ur Screen NONE DETECTED NONE DETECTED   Cocaine Metabolite,Ur [place] NONE DETECTED NONE DETECTED   Opiate, Ur Screen NONE DETECTED NONE DETECTED   Phencyclidine (PCP) Ur S NONE DETECTED NONE DETECTED   Cannabinoid 50 Ng, Ur [place] NONE DETECTED NONE DETECTED   Barbiturates, Ur Screen NONE DETECTED NONE DETECTED   Benzodiazepine, Ur Scrn POSITIVE (A) NONE DETECTED   Methadone Scn, Ur POSITIVE (A) NONE DETECTED    Comment: (NOTE) 828  Tricyclics, urine               Cutoff 1000 ng/mL 200  Amphetamines, urine             Cutoff 1000 ng/mL 300  MDMA (Ecstasy), urine           Cutoff 500 ng/mL 400  Cocaine Metabolite, urine       Cutoff 300 ng/mL 500  Opiate, urine                   Cutoff 300 ng/mL 600  Phencyclidine (PCP), urine      Cutoff 25 ng/mL 700  Cannabinoid, urine              Cutoff 50 ng/mL 800  Barbiturates, urine             Cutoff 200 ng/mL 900  Benzodiazepine, urine           Cutoff 200 ng/mL 1000 Methadone, urine                Cutoff 300 ng/mL 1100 1200 The urine drug screen provides only a preliminary, unconfirmed 1300 analytical test result and should not be used for non-medical 1400 purposes. Clinical consideration and professional judgment should 1500 be applied to any positive drug screen result due to possible 1600 interfering substances. A more specific alternate chemical method 1700 must be used in order to  obtain a confirmed analytical result.  1800 Gas chromato graphy / mass spectrometry (GC/MS) is the preferred 1900 confirmatory method.   Urinalysis, Routine w reflex microscopic     Status: Abnormal   Collection Time: 02/07/16 10:45 PM  Result Value Ref Range   Color, Urine YELLOW (A) YELLOW   APPearance CLEAR (A) CLEAR   Specific Gravity, Urine 1.023 1.005 - 1.030   pH 6.0 5.0 - 8.0   Glucose, UA NEGATIVE NEGATIVE mg/dL   Hgb urine dipstick NEGATIVE NEGATIVE   Bilirubin Urine NEGATIVE NEGATIVE   Ketones, ur 5 (A) NEGATIVE mg/dL   Protein, ur NEGATIVE NEGATIVE mg/dL   Nitrite NEGATIVE NEGATIVE   Leukocytes, UA NEGATIVE NEGATIVE    No current facility-administered medications for this encounter.    Current Outpatient Prescriptions  Medication Sig Dispense Refill  . benzonatate (TESSALON) 100 MG capsule Take 2 capsules (200 mg total) by mouth 2 (two) times daily as needed for cough. 20 capsule 0  . ibuprofen (ADVIL,MOTRIN) 200 MG tablet Take 800 mg by mouth every 8 (eight) hours as needed for moderate pain.    Marland Kitchen oxymetazoline (AFRIN NASAL SPRAY) 0.05 % nasal spray Place 1 spray into both nostrils 2 (two) times daily. Spray once into each nostril twice daily for up to the next 3 days. Do not use for more than 3 days to prevent rebound rhinorrhea. 30 mL 0  . Phenyleph-Doxylamine-DM-APAP (  ALKA-SELTZER PLS ALLERGY & [facility]) 5-6.25-10-325 MG CAPS Take 2 capsules by mouth daily as needed (cold).      Musculoskeletal: Strength & Muscle Tone: within normal limits Gait & Station: ataxic Patient leans: N/A  Psychiatric Specialty Exam: Physical Exam  Nursing note and vitals reviewed. Constitutional: She appears well-developed and well-nourished.  HENT:  Head: Normocephalic and atraumatic.  Eyes: Conjunctivae are normal. Pupils are equal, round, and reactive to light.  Neck: Normal range of motion.  Cardiovascular: Regular rhythm and normal heart sounds.   Respiratory: Effort normal. No  respiratory distress.  GI: Soft.  Musculoskeletal: Normal range of motion.  Neurological: She is alert.  Skin: Skin is warm and dry.  Psychiatric: Her affect is blunt. Her speech is delayed. She is slowed. Thought content is not paranoid. She expresses impulsivity. She expresses no homicidal and no suicidal ideation. She exhibits abnormal recent memory.    Review of Systems  Constitutional: Negative.   HENT: Negative.   Eyes: Negative.   Respiratory: Negative.   Cardiovascular: Negative.   Gastrointestinal: Negative.   Musculoskeletal: Negative.   Skin: Negative.   Neurological: Negative.   Psychiatric/Behavioral: Positive for memory loss and substance abuse. Negative for depression, hallucinations and suicidal ideas. The patient is not nervous/anxious and does not have insomnia.     Blood pressure 124/78, pulse 96, resp. rate 20, height _0  (1.676 m), weight 90.7 kg (200 lb), SpO2 99 %.Body mass index is 32.28 kg/m.  General Appearance: Disheveled  Eye Contact:  Fair  Speech:  Slow  Volume:  Decreased  Mood:  Dysphoric  Affect:  Congruent  Thought Process:  Goal Directed  Orientation:  Full (Time, Place, and Person)  Thought Content:  Logical  Suicidal Thoughts:  No  Homicidal Thoughts:  No  Memory:  Immediate;   Good Recent;   Poor Remote;   Poor  Judgement:  Impaired  Insight:  Shallow  Psychomotor Activity:  Decreased  Concentration:  Concentration: Fair  Recall:  [employer] of Knowledge:  Fair  Language:  Fair  Akathisia:  No  Handed:  Right  AIMS (if indicated):     Assets:  Desire for Improvement Housing Physical Health Resilience  ADL's:  Intact  Cognition:  Impaired,  Mild  Sleep:        Treatment Plan Summary: Plan 41 year old woman brought to the hospital for an overdose. It seems clear that she overdosed on benzodiazepines at least and possibly other sedatives although it does not appear that there is any evidence that she was trying to kill her  self. Clearly denies suicidal ideation. Denies symptoms of major depression. Patient has an established drug addiction problem. She has been advised and educated about the dangers of combining sedative drugs particularly when she is already taking methadone. Patient is requesting discharge. Involuntary commitment discontinued. She can be discharged back to outpatient treatment at the discretion of the emergency room physician.  Disposition: Patient does not meet criteria for psychiatric inpatient admission. Supportive therapy provided about ongoing stressors.  Alethia Berthold, MD 02/08/2016 3:36 PM

## 2016-02-08 NOTE — ED Notes (Signed)
Sister at bedside.

## 2016-02-08 NOTE — ED Notes (Signed)
Report given to Amy T RN 

## 2016-04-01 ENCOUNTER — Encounter (HOSPITAL_BASED_OUTPATIENT_CLINIC_OR_DEPARTMENT_OTHER): Payer: Self-pay | Admitting: *Deleted

## 2016-04-01 ENCOUNTER — Emergency Department (HOSPITAL_BASED_OUTPATIENT_CLINIC_OR_DEPARTMENT_OTHER)
Admission: EM | Admit: 2016-04-01 | Discharge: 2016-04-01 | Disposition: A | Discharge status: Home / Self Care | Payer: Medicaid Other | Attending: Emergency Medicine | Admitting: Emergency Medicine

## 2016-04-01 DIAGNOSIS — M79642 Pain in left hand: Secondary | ICD-10-CM | POA: Insufficient documentation

## 2016-04-01 DIAGNOSIS — K0889 Other specified disorders of teeth and supporting structures: Secondary | ICD-10-CM | POA: Insufficient documentation

## 2016-04-01 DIAGNOSIS — R05 Cough: Secondary | ICD-10-CM | POA: Insufficient documentation

## 2016-04-01 DIAGNOSIS — F17200 Nicotine dependence, unspecified, uncomplicated: Secondary | ICD-10-CM | POA: Insufficient documentation

## 2016-04-01 DIAGNOSIS — I10 Essential (primary) hypertension: Secondary | ICD-10-CM | POA: Insufficient documentation

## 2016-04-01 MED ORDER — PENICILLIN V POTASSIUM 500 MG PO TABS: 500.0000 mg | ORAL_TABLET | Freq: Three times a day (TID) | ORAL | 0 refills | Status: AC

## 2016-04-01 NOTE — Discharge Instructions (Signed)
Call the number on these discharge instructions to get a primary care physician. Your new primary care physician can see you regarding your hand pain if not improved in a week. He can hold an ice pack on your left hand 4 times daily for 30 minutes at a time. Take Advil 2 tablets every 6 hours as needed for pain. You can also call Dr.Hudnall to schedule appointment if your hand is not better in a week. Call Dr. Mayford Knife, dentist at [phone] or any of the numbers on the resource guide to get the next available dental appointment. Take the penicillin as prescribed
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
## 2016-04-01 NOTE — ED Notes (Addendum)
Pt upset she is not getting pain medication. States "I have never been to a hospital that doesn't tell you whats wrong and give you something for the pain. It was a waste coming here, I'll just keep eating ibuprofen." appologized to pt

## 2016-04-01 NOTE — ED Triage Notes (Signed)
Pt reports L lower and upper dental pain x2wks. Denies fever, difficulty swallowing, sob. Also reports L hand pain x19mo. Denies known injury, no numbness/tingling.

## 2016-04-01 NOTE — ED Notes (Signed)
ED Provider at bedside. 

## 2017-01-10 ENCOUNTER — Emergency Department (HOSPITAL_COMMUNITY)
Admission: EM | Admit: 2017-01-10 | Discharge: 2017-01-10 | Discharge status: Against Medical Advice | Payer: No Typology Code available for payment source

## 2017-01-10 NOTE — ED Notes (Signed)
Called f

## 2017-01-14 ENCOUNTER — Emergency Department (HOSPITAL_COMMUNITY): Payer: Self-pay

## 2017-01-14 ENCOUNTER — Emergency Department (HOSPITAL_COMMUNITY)
Admission: EM | Admit: 2017-01-14 | Discharge: 2017-01-14 | Disposition: A | Discharge status: Home / Self Care | Payer: Self-pay | Attending: Emergency Medicine | Admitting: Emergency Medicine

## 2017-01-14 ENCOUNTER — Encounter (HOSPITAL_COMMUNITY): Payer: Self-pay | Admitting: Emergency Medicine

## 2017-01-14 DIAGNOSIS — Z79899 Other long term (current) drug therapy: Secondary | ICD-10-CM | POA: Insufficient documentation

## 2017-01-14 DIAGNOSIS — M25561 Pain in right knee: Secondary | ICD-10-CM | POA: Insufficient documentation

## 2017-01-14 DIAGNOSIS — M25571 Pain in right ankle and joints of right foot: Secondary | ICD-10-CM | POA: Insufficient documentation

## 2017-01-14 DIAGNOSIS — F17200 Nicotine dependence, unspecified, uncomplicated: Secondary | ICD-10-CM | POA: Insufficient documentation

## 2017-01-14 DIAGNOSIS — I10 Essential (primary) hypertension: Secondary | ICD-10-CM | POA: Insufficient documentation

## 2017-01-14 MED ORDER — KETOROLAC TROMETHAMINE 15 MG/ML IJ SOLN
30.0000 mg | Freq: Once | INTRAMUSCULAR | Status: AC
  Administered 2017-01-14: 30 mg via INTRAMUSCULAR
  Filled 2017-01-14: qty 2
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
## 2017-01-14 NOTE — ED Notes (Signed)
Pt refused knee immobilizer
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
## 2017-01-14 NOTE — ED Notes (Signed)
Pt back from X-ray.  

## 2017-01-20 ENCOUNTER — Other Ambulatory Visit: Payer: Self-pay

## 2017-01-20 ENCOUNTER — Encounter (HOSPITAL_COMMUNITY): Payer: Self-pay | Admitting: Emergency Medicine

## 2017-01-20 ENCOUNTER — Emergency Department (HOSPITAL_COMMUNITY)
Admission: EM | Admit: 2017-01-20 | Discharge: 2017-01-20 | Disposition: A | Discharge status: Home / Self Care | Payer: Self-pay | Attending: Emergency Medicine | Admitting: Emergency Medicine

## 2017-01-20 DIAGNOSIS — Z79899 Other long term (current) drug therapy: Secondary | ICD-10-CM | POA: Insufficient documentation

## 2017-01-20 DIAGNOSIS — I10 Essential (primary) hypertension: Secondary | ICD-10-CM | POA: Insufficient documentation

## 2017-01-20 DIAGNOSIS — F419 Anxiety disorder, unspecified: Secondary | ICD-10-CM | POA: Insufficient documentation

## 2017-01-20 DIAGNOSIS — T481X4A Poisoning by skeletal muscle relaxants [neuromuscular blocking agents], undetermined, initial encounter: Secondary | ICD-10-CM | POA: Insufficient documentation

## 2017-01-20 DIAGNOSIS — F1910 Other psychoactive substance abuse, uncomplicated: Secondary | ICD-10-CM

## 2017-01-20 DIAGNOSIS — G8929 Other chronic pain: Secondary | ICD-10-CM | POA: Insufficient documentation

## 2017-01-20 LAB — RAPID URINE DRUG SCREEN, HOSP PERFORMED
Amphetamines: NOT DETECTED
BARBITURATES: NOT DETECTED
Benzodiazepines: POSITIVE — AB
COCAINE: NOT DETECTED
OPIATES: NOT DETECTED
Tetrahydrocannabinol: NOT DETECTED

## 2017-01-20 LAB — COMPREHENSIVE METABOLIC PANEL
ALK PHOS: 94 U/L (ref 38–126)
ALT: 120 U/L — ABNORMAL HIGH (ref 14–54)
ANION GAP: 6 (ref 5–15)
AST: 48 U/L — ABNORMAL HIGH (ref 15–41)
Albumin: 3.9 g/dL (ref 3.5–5.0)
BILIRUBIN TOTAL: 0.4 mg/dL (ref 0.3–1.2)
BUN: 12 mg/dL (ref 6–20)
CALCIUM: 9 mg/dL (ref 8.9–10.3)
CO2: 29 mmol/L (ref 22–32)
Chloride: 100 mmol/L — ABNORMAL LOW (ref 101–111)
Creatinine, Ser: 0.63 mg/dL (ref 0.44–1.00)
GFR calc non Af Amer: >60 mL/min (ref >60)
Glucose, Bld: 97 mg/dL (ref 65–99)
Potassium: 4.1 mmol/L (ref 3.5–5.1)
Sodium: 135 mmol/L (ref 135–145)
TOTAL PROTEIN: 7.6 g/dL (ref 6.5–8.1)

## 2017-01-20 LAB — CBC
HCT: 39.8 % (ref 36.0–46.0)
Hemoglobin: 13.4 g/dL (ref 12.0–15.0)
MCH: 29 pg (ref 26.0–34.0)
MCHC: 33.7 g/dL (ref 30.0–36.0)
MCV: 86.1 fL (ref 78.0–100.0)
Platelets: 223 10*3/uL (ref 150–400)
RBC: 4.62 MIL/uL (ref 3.87–5.11)
RDW: 12.9 % (ref 11.5–15.5)
WBC: 8.3 10*3/uL (ref 4.0–10.5)

## 2017-01-20 LAB — SALICYLATE LEVEL

## 2017-01-20 LAB — ETHANOL

## 2017-01-20 LAB — I-STAT BETA HCG BLOOD, ED (MC, WL, AP ONLY): I-stat hCG, quantitative: <5 m[IU]/mL (ref <5)

## 2017-01-20 LAB — ACETAMINOPHEN LEVEL
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
## 2017-01-20 NOTE — Discharge Instructions (Signed)
Substance Abuse Treatment Programs ° °Intensive Outpatient Programs °[facility]     °[address]      °[place], [place]                   °[phone]      ° °[facility] °[address] °[place], [place] °336-379-7146 ° °[facility]     °(Inpatient and outpatient)     °[address]           °[phone]   ° °[facility] °336-288-1484 (Suboxone and Methadone) ° °[address]      °[place], [place] [address]      °336-882-2125      ° °[address] °[place], [place] °852-3033 ° °[facility] (Outpatient/Inpatient, Chemical)    °(insurance only) [phone]      °       °[facility] (Groups & Residential) °[place], [place] °336-389-1413 ° °   °[facility]     °[address]     °[place], [place]      °336-389-1413      ° °[facility] (for caregivers and family) °[address] °[place], [place] °336-299-4655 ° ° ° ° ° °Residential Treatment Programs °[facility]      °[address], [place], [place] [address]  °([phone]) [phone]      ° °[facility] °[address] [place], [place] [address] °[phone] ° °[facility]        °336-248-8914      ° °[facility] °1-800-659-3381 ° °[facility]             °[address]                                         °[place], [place]                                                °877-615-2722 or 336-784-9470                              ° °[facility] [place] °[address] °[place], [address] °1.877.941.8954 ° °[facility]    °[address]      °[place], [place]     °[phone]      ° °[facility] °[address] °[place], [place] °336-285-9073 ° °[facility]   °[address]     °[place], [place] [address]     °336-899-1550      °Admissions: 8am-3pm M-F ° °[facility] °[address] °[place],  [place] °336-227-7417 ° °[facility]: Residential Program (90 Days)   °[place], [place]      °336-725-8389 or 800-758-6077    ° °[facility] °[place], [place] °(Walk in Hours over the weekend or by referral) ° °[facility] °[address], [place], [place] [address] °(336) [phone] ° °Crisis Mobile: [facility]:  1-877-626-1772 (for crisis response 24 hours a day) °[facility] Hotline:      1-800-256-2452 °Outpatient Psychiatry and Counseling ° °[facility]: Mobile Crisis   Management 24 hours:  1-877-626-1772 ° °[facility] sliding scale fee and walk in schedule: M-F 8am-12pm/1pm-3pm °[address]  °[place], [place] [address] °[phone] ° °[facility] °[address] °[place], [place] [address] °336-703-9650 ° °[facility] ([facility] known [facility])- new patient walk-in appointments available Monday - Friday 8am -3pm.          °[address] °[place], [place] [address] °336-676-6840 or crisis line- 336-676-6905 ° °[facility] Intensive Outpatient Therapy Program °[address] °[place], [place] [address] °336-832-9804 ° °[facility]                  °Crisis Services      °336.641.4993      °[address]     °[place], [place] [address]                ° °[facility]   °[facility] °[phone] °[address] °[place], [place] [address] ° ° °[facility]          °[address]  °[place], [place] [address]       °(336) [phone] ° °[facility] °[address] °[place], [place] [address] °336-292-1510 ° °[facility]    °[address]    °[place], [place] [address]     °336-632-3505      ° °Parish McKinney, MD     °[address]     °[place] [address]     °336-282-1251     °  °[facility] °[address] °[place] [address] ° °[facility]     °[address]     °[place], [place]      °336-542-2076      ° °[facility] °Shamsher Ahluwalia, MD °[address] °[place], [place] [address] °336-420-9558 ° °[facility]     °[address]     °[place], [place] [address]     °336-274-1237      ° °[facility] °[address] °[place], [place] [address] °336-854-4450 °Resources for Temporary Residential Assistance/Crisis Centers ° °DAY CENTERS °[facility] °M-F 8am-3pm   °[address] [place], [place] [address]   336-332-0824 °Services include: laundry, barbering, support groups, case management, phone  & computer access, showers, AA/NA mtgs, mental health/substance abuse nurse, job skills class, disability information, VA assistance, spiritual classes, etc.  ° °HOMELESS SHELTERS ° °[facility]     °[facility]   °[address], [place]     °336.271.5959       °       °[facility] (women and children)       °[address] °[place], [place] [address] °336-275-0820 °[email or web address] for application and process °Application Required ° °[facility]   °[address]    °[place] [address]     °[phone]       °             °[facility] °[address] °, [place] [address] °336.273.5572 °336-235-0363(schedule application appt.) °Application Required ° °[facility] (women only)    °[address]     °[place], [place] [address]     °336-884-1039      °  Intake starts 6pm daily °Need valid ID, SSC, & Police report °[facility] [place] °[address] °[place], [place] °336-881-5420 °Application Required ° °[facility] (men only)     °[address]      °[place], [place]     °[phone]      ° °[facility] °(Pregnant women only) °[address] °[place], [place] °336-275-0206 ° °[facility]      °[address]      °[place], [place] [address]     °336-722-9951      °       °[facility] °[address] °[place], [place] °336-723-1848 °90 day commitment/SA/Application process ° °[facility](men only)     °[address]     °[place], [place]     °[phone]       °Check-in at 7pm     °       °[facility] °[address] °[place], [place] [address] °336-248-6684 °Men/Women/Women and Children must be there by 7 pm ° °[facility] °[place], [place] °336-722-8721                ° °

## 2017-01-20 NOTE — ED Triage Notes (Signed)
Patient complaining of right knee pain. Patient states she took 8 flexeril about 7:30 am. Patient speech is slurred and patient is sleepy.

## 2017-01-20 NOTE — Patient Outreach (Signed)
ED Peer Support Specialist Patient Intake (Complete at intake & 30-60 Day Follow-up)  Name: Kelli [place]  MRN: [id number]  Age: 42 y.o.   Date of Admission: 01/20/2017  Intake: Initial Comments:      Primary Reason Admitted: Patient complaining of right knee pain. Patient states she took 8 flexeril about 7:30 am. Patient speech is slurred and patient is sleepy  Lab values: Alcohol/ETOH: Positive Positive UDS? Yes Amphetamines: No Barbiturates: No Benzodiazepines: Yes Cocaine: No Opiates: No Cannabinoids: No  Demographic information: Gender: Female Ethnicity: White Marital Status: Separated Insurance Status: Uninsured/Self-pay Control and instrumentation engineereceives non-medical governmental assistance (Work Engineer, agriculturalirst/Welfare, Sales executivefood stamps, etc.: No Lives with: Alone Living situation: House/Apartment  Reported Patient History: Patient reported health conditions: Depression(PTSD, anxiety) Patient aware of HIV and hepatitis status: Yes (comment)(Hepatitis C)  In past year, has patient visited ED for any reason? Yes  Number of ED visits: 1  Reason(s) for visit: knee pain  In past year, has patient been hospitalized for any reason? No  Number of hospitalizations:    Reason(s) for hospitalization:    In past year, has patient been arrested? No  Number of arrests:    Reason(s) for arrest:    In past year, has patient been incarcerated? No  Number of incarcerations:    Reason(s) for incarceration:    In past year, has patient received medication-assisted treatment? Yes, [facility] ([facility]  In past year, patient received the following treatments:    In past year, has patient received any harm reduction services? No  Did this include any of the following?    In past year, has patient received care from a mental health provider for diagnosis other than SUD? No(Patient is in the process of [facility])  In past year, is this first time  patient has overdosed? No(Has not overdosed)  Number of past overdoses:    In past year, is this first time patient has been hospitalized for an overdose? (has not overdosed)  Number of hospitalizations for overdose(s):    Is patient currently receiving treatment for a mental health diagnosis? No  Patient reports experiencing difficulty participating in SUD treatment: No    Most important reason(s) for this difficulty?    Has patient received prior services for treatment? 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 )  Other information: CPSS also provided CPSS contact information. CPSS encouraged the patient to contact [facility]PSS at any time for substance use recovery support.    Bartholomew BoardsJohn Atisha Hamidi, CPSS  01/20/2017 4:12 PM

## 2017-01-20 NOTE — ED Notes (Signed)
Patient ambulated with no difficulty to and from restroom

## 2017-01-20 NOTE — Progress Notes (Addendum)
Consult request has been received. CSW attempting to follow up at present time.  Pt's EDP requested CSW speak to the pt.  Per EDP, pt is requesting assistance with transportaton.  Per EDP and RN's notes, pt can ambulate "with no difficulty". The last bus downtown that will connect with another bus downtown [employer] [place] at 5:47.  Other buses going downtown come by at 4:47 also with the final downtown at 6:47pm.  CSW will offer pt a bus pass and encourage patient to call family.  CSW provided a bus pass and bus schedules to the pt as well as written and verbal instructions on how to return to the pt's car at [place] by bus.  Pt was appreciative and thanked the CSW.  RN updated.  Please reconsult if future social work needs arise.  CSW signing off, as social work intervention is no longer needed.  Dorothe PeaJonathan F. Mayerli Kirst, LCSW, LCAS, CSI Clinical Social Worker Ph: [phone]

## 2017-01-20 NOTE — ED Notes (Signed)
Pt given coke and ham sandwich. Peer support and social work at bedside.

## 2017-01-20 NOTE — ED Notes (Signed)
Pt sleepy with slurred speech. Pt sts that she wants to go home and attempts to get out of bed. Charge RN tells her to get back in bed because she is not safe to go home yet. Pt is cooperative at this time
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
